# Patient Record
Sex: Female | Born: 1935 | Race: White | Hispanic: No | Smoking: Never smoker
Health system: Southern US, Community
[De-identification: ages and names within clinical notes are randomized; demographics above are authoritative.]

## PROBLEM LIST (undated history)

## (undated) DIAGNOSIS — N183 Chronic kidney disease, stage 3 unspecified: Secondary | ICD-10-CM

## (undated) DIAGNOSIS — I10 Essential (primary) hypertension: Secondary | ICD-10-CM

## (undated) DIAGNOSIS — H409 Unspecified glaucoma: Secondary | ICD-10-CM

## (undated) DIAGNOSIS — M1711 Unilateral primary osteoarthritis, right knee: Secondary | ICD-10-CM

## (undated) DIAGNOSIS — N6019 Diffuse cystic mastopathy of unspecified breast: Secondary | ICD-10-CM

## (undated) DIAGNOSIS — D72819 Decreased white blood cell count, unspecified: Secondary | ICD-10-CM

## (undated) DIAGNOSIS — K589 Irritable bowel syndrome without diarrhea: Secondary | ICD-10-CM

## (undated) DIAGNOSIS — R7989 Other specified abnormal findings of blood chemistry: Secondary | ICD-10-CM

## (undated) DIAGNOSIS — Z789 Other specified health status: Secondary | ICD-10-CM

## (undated) DIAGNOSIS — E559 Vitamin D deficiency, unspecified: Secondary | ICD-10-CM

## (undated) DIAGNOSIS — M810 Age-related osteoporosis without current pathological fracture: Secondary | ICD-10-CM

## (undated) HISTORY — PX: JOINT REPLACEMENT: SHX530

## (undated) HISTORY — PX: MOHS SURGERY: SHX181

## (undated) HISTORY — DX: Other specified abnormal findings of blood chemistry: R79.89

## (undated) HISTORY — DX: Essential (primary) hypertension: I10

## (undated) HISTORY — DX: Age-related osteoporosis without current pathological fracture: M81.0

## (undated) HISTORY — DX: Other specified health status: Z78.9

## (undated) HISTORY — DX: Vitamin D deficiency, unspecified: E55.9

## (undated) HISTORY — DX: Unilateral primary osteoarthritis, right knee: M17.11

## (undated) HISTORY — PX: OTHER SURGICAL HISTORY: SHX169

## (undated) HISTORY — DX: Chronic kidney disease, stage 3 (moderate): N18.3

## (undated) HISTORY — DX: Chronic kidney disease, stage 3 unspecified: N18.30

## (undated) HISTORY — DX: Irritable bowel syndrome without diarrhea: K58.9

## (undated) HISTORY — DX: Unspecified glaucoma: H40.9

## (undated) HISTORY — PX: BREAST BIOPSY: SHX20

## (undated) HISTORY — DX: Diffuse cystic mastopathy of unspecified breast: N60.19

## (undated) HISTORY — PX: CATARACT EXTRACTION, BILATERAL: SHX1313

---

## 2007-11-06 ENCOUNTER — Encounter: Admission: RE | Admit: 2007-11-06 | Discharge: 2007-12-13 | Payer: Self-pay | Admitting: Family Medicine

## 2013-12-02 ENCOUNTER — Inpatient Hospital Stay (HOSPITAL_COMMUNITY)
Admission: EM | Admit: 2013-12-02 | Discharge: 2013-12-04 | DRG: 682 | Disposition: A | Discharge status: Home Health | Payer: Medicare Other | Attending: Family Medicine | Admitting: Family Medicine

## 2013-12-02 ENCOUNTER — Emergency Department (HOSPITAL_COMMUNITY): Payer: Medicare Other

## 2013-12-02 ENCOUNTER — Encounter (HOSPITAL_COMMUNITY): Payer: Self-pay | Admitting: Emergency Medicine

## 2013-12-02 DIAGNOSIS — J189 Pneumonia, unspecified organism: Secondary | ICD-10-CM | POA: Diagnosis present

## 2013-12-02 DIAGNOSIS — R531 Weakness: Secondary | ICD-10-CM

## 2013-12-02 DIAGNOSIS — D72829 Elevated white blood cell count, unspecified: Secondary | ICD-10-CM | POA: Diagnosis present

## 2013-12-02 DIAGNOSIS — Z7982 Long term (current) use of aspirin: Secondary | ICD-10-CM

## 2013-12-02 DIAGNOSIS — E43 Unspecified severe protein-calorie malnutrition: Secondary | ICD-10-CM | POA: Insufficient documentation

## 2013-12-02 DIAGNOSIS — I10 Essential (primary) hypertension: Secondary | ICD-10-CM | POA: Diagnosis present

## 2013-12-02 DIAGNOSIS — M810 Age-related osteoporosis without current pathological fracture: Secondary | ICD-10-CM | POA: Diagnosis present

## 2013-12-02 DIAGNOSIS — D72819 Decreased white blood cell count, unspecified: Secondary | ICD-10-CM | POA: Diagnosis present

## 2013-12-02 DIAGNOSIS — R0902 Hypoxemia: Secondary | ICD-10-CM | POA: Diagnosis present

## 2013-12-02 DIAGNOSIS — Z66 Do not resuscitate: Secondary | ICD-10-CM

## 2013-12-02 DIAGNOSIS — R6883 Chills (without fever): Secondary | ICD-10-CM | POA: Diagnosis present

## 2013-12-02 DIAGNOSIS — IMO0002 Reserved for concepts with insufficient information to code with codable children: Secondary | ICD-10-CM

## 2013-12-02 DIAGNOSIS — E869 Volume depletion, unspecified: Secondary | ICD-10-CM | POA: Diagnosis present

## 2013-12-02 DIAGNOSIS — N179 Acute kidney failure, unspecified: Principal | ICD-10-CM | POA: Diagnosis present

## 2013-12-02 DIAGNOSIS — E860 Dehydration: Secondary | ICD-10-CM | POA: Diagnosis present

## 2013-12-02 DIAGNOSIS — R197 Diarrhea, unspecified: Secondary | ICD-10-CM | POA: Diagnosis present

## 2013-12-02 DIAGNOSIS — R5381 Other malaise: Secondary | ICD-10-CM | POA: Diagnosis present

## 2013-12-02 HISTORY — DX: Decreased white blood cell count, unspecified: D72.819

## 2013-12-02 LAB — BASIC METABOLIC PANEL
BUN: 60 mg/dL — ABNORMAL HIGH (ref 6–23)
Calcium: 9.4 mg/dL (ref 8.4–10.5)
Creatinine, Ser: 1.91 mg/dL — ABNORMAL HIGH (ref 0.50–1.10)
GFR calc Af Amer: 25 mL/min — ABNORMAL LOW (ref >90)
GFR calc non Af Amer: 22 mL/min — ABNORMAL LOW (ref >90)
Glucose, Bld: 114 mg/dL — ABNORMAL HIGH (ref 70–99)
Potassium: 4.4 mEq/L (ref 3.5–5.1)

## 2013-12-02 LAB — CBC WITH DIFFERENTIAL/PLATELET
Basophils Relative: 0 % (ref 0–1)
Eosinophils Absolute: 0 10*3/uL (ref 0.0–0.7)
Eosinophils Relative: 0 % (ref 0–5)
HCT: 29.4 % — ABNORMAL LOW (ref 36.0–46.0)
Hemoglobin: 9.8 g/dL — ABNORMAL LOW (ref 12.0–15.0)
Lymphs Abs: 1.2 10*3/uL (ref 0.7–4.0)
MCH: 30.4 pg (ref 26.0–34.0)
MCHC: 33.3 g/dL (ref 30.0–36.0)
MCV: 91.3 fL (ref 78.0–100.0)
Monocytes Absolute: 0.8 10*3/uL (ref 0.1–1.0)
Monocytes Relative: 7 % (ref 3–12)
Neutrophils Relative %: 83 % — ABNORMAL HIGH (ref 43–77)
Platelets: 294 10*3/uL (ref 150–400)
RBC: 3.22 MIL/uL — ABNORMAL LOW (ref 3.87–5.11)
RDW: 13.5 % (ref 11.5–15.5)

## 2013-12-02 LAB — URINALYSIS, ROUTINE W REFLEX MICROSCOPIC
Bilirubin Urine: NEGATIVE
Glucose, UA: NEGATIVE mg/dL
Hgb urine dipstick: NEGATIVE
Protein, ur: NEGATIVE mg/dL
Urobilinogen, UA: 0.2 mg/dL (ref 0.0–1.0)

## 2013-12-02 LAB — URINE MICROSCOPIC-ADD ON

## 2013-12-02 MED ORDER — DEXTROSE 5 % IV SOLN
1.0000 g | Freq: Once | INTRAVENOUS | Status: AC
  Administered 2013-12-02: 1 g via INTRAVENOUS
  Filled 2013-12-02: qty 10

## 2013-12-02 MED ORDER — LEVOFLOXACIN IN D5W 750 MG/150ML IV SOLN
750.0000 mg | INTRAVENOUS | Status: DC
  Administered 2013-12-02 – 2013-12-04 (×2): 750 mg via INTRAVENOUS
  Filled 2013-12-02 (×2): qty 150

## 2013-12-02 MED ORDER — ASPIRIN EC 81 MG PO TBEC
81.0000 mg | DELAYED_RELEASE_TABLET | Freq: Every day | ORAL | Status: DC
Start: 2013-12-02 — End: 2013-12-04
  Administered 2013-12-02 – 2013-12-04 (×3): 81 mg via ORAL
  Filled 2013-12-02 (×3): qty 1

## 2013-12-02 MED ORDER — ONDANSETRON HCL 4 MG/2ML IJ SOLN
4.0000 mg | Freq: Once | INTRAMUSCULAR | Status: DC
Start: 2013-12-02 — End: 2013-12-04
  Filled 2013-12-02: qty 2

## 2013-12-02 MED ORDER — ONDANSETRON HCL 4 MG PO TABS: 4.0000 mg | ORAL_TABLET | Freq: Four times a day (QID) | ORAL | Status: DC | PRN

## 2013-12-02 MED ORDER — DEXTROSE 5 % IV SOLN
500.0000 mg | Freq: Once | INTRAVENOUS | Status: DC
  Administered 2013-12-02: 500 mg via INTRAVENOUS

## 2013-12-02 MED ORDER — DEXTROSE-NACL 5-0.9 % IV SOLN
INTRAVENOUS | Status: DC
  Administered 2013-12-02 – 2013-12-03 (×3): via INTRAVENOUS
  Filled 2013-12-02: qty 1000

## 2013-12-02 MED ORDER — BOOST PLUS PO LIQD
237.0000 mL | Freq: Two times a day (BID) | ORAL | Status: DC
  Administered 2013-12-02 – 2013-12-04 (×3): 237 mL via ORAL
  Filled 2013-12-02 (×5): qty 237

## 2013-12-02 MED ORDER — ONDANSETRON HCL 4 MG/2ML IJ SOLN
4.0000 mg | Freq: Four times a day (QID) | INTRAMUSCULAR | Status: DC | PRN
  Administered 2013-12-02: 4 mg via INTRAVENOUS
  Filled 2013-12-02: qty 2

## 2013-12-02 MED ORDER — SODIUM CHLORIDE 0.9 % IV BOLUS (SEPSIS)
500.0000 mL | Freq: Once | INTRAVENOUS | Status: AC
  Administered 2013-12-02: 500 mL via INTRAVENOUS

## 2013-12-02 MED ORDER — SODIUM CHLORIDE 0.9 % IV SOLN
INTRAVENOUS | Status: DC
  Administered 2013-12-02: 125 mL/h via INTRAVENOUS

## 2013-12-02 NOTE — ED Notes (Signed)
Bed: WU98 Expected date: 12/02/13 Expected time: 1:14 AM Means of arrival: Ambulance Comments: Weakness, dehydration,

## 2013-12-02 NOTE — ED Provider Notes (Signed)
TIME SEEN: 1:38 AM  CHIEF COMPLAINT: Dehydration, generalized weakness and fatigue, chills, anorexia  HPI: Patient is a 79 year old female with history of hypertension and prior leukopenia who presents to the emergency department with complaints of 2 weeks of chills, anorexia, generalized weakness and fatigue. Patient was seen by her primary care physician, Dr. Sigmund Hazel. She had labs drawn this evening and was called to come into the emergency department for evaluation. Patient had a leukocytosis of 15.1, a creatinine of 2.17 (which has increased from 1.29 on 08/16/12), and ESR of 73. Patient's hemoglobin was 10.6 and her TSH 1.66. Patient was instructed to come to the emergency department for IV hydration and further evaluation. Patient denies having any pain. She denies chest pain or abdominal pain. No shortness of breath. No cough, vomiting, dysuria or hematuria. She has had intermittent diarrhea. Patient was mildly hypoxic initially in the emergency department and placed on oxygen. She denies a history of tobacco use, COPD or asthma, oxygen requirement at home. No history of PE or DVT.  ROS: See HPI Constitutional: no fever  Eyes: no drainage  ENT: no runny nose   Cardiovascular:  no chest pain  Resp: no SOB  GI: no vomiting GU: no dysuria Integumentary: no rash  Allergy: no hives  Musculoskeletal: no leg swelling  Neurological: no slurred speech ROS otherwise negative  PAST MEDICAL HISTORY/PAST SURGICAL HISTORY:  No past medical history on file.  MEDICATIONS:  Prior to Admission medications   Not on File    ALLERGIES:  Allergies not on file  SOCIAL HISTORY:  History  Substance Use Topics  . Smoking status: Not on file  . Smokeless tobacco: Not on file  . Alcohol Use: Not on file   denies alcohol or tobacco use  FAMILY HISTORY: No family history on file.  EXAM: BP 115/49  Pulse 92  Temp(Src) 97.8 F (36.6 C) (Oral)  Resp 20  Wt 142 lb (64.411 kg)  SpO2  92% CONSTITUTIONAL: Alert and oriented and responds appropriately to questions. Elderly, no apparent distress HEAD: Normocephalic EYES: Conjunctivae clear, PERRL ENT: normal nose; no rhinorrhea; dry mucous membranes; pharynx without lesions noted NECK: Supple, no meningismus, no LAD  CARD: RRR; S1 and S2 appreciated; no murmurs, no clicks, no rubs, no gallops RESP: Normal chest excursion without splinting or tachypnea; breath sounds clear and equal bilaterally; no wheezes, no rhonchi, no rales,  ABD/GI: Normal bowel sounds; non-distended; soft, non-tender, no rebound, no guarding BACK:  The back appears normal and is non-tender to palpation, there is no CVA tenderness EXT: Normal ROM in all joints; non-tender to palpation; no edema; normal capillary refill; no cyanosis    SKIN: Normal color for age and race; warm NEURO: Moves all extremities equally, no facial droop or slurred speech PSYCH: The patient's mood and manner are appropriate. Grooming and personal hygiene are appropriate.  MEDICAL DECISION MAKING: Patient here with failure to thrive, anorexia and dehydration. She is hemodynamically stable. Will repeat blood work and obtain chest x-ray and urinalysis to rule out infectious etiology. Her abdominal exam is completely benign. Will continue IV hydration. Patient will likely need admission.  ED PROGRESS: Patient's labs show leukocytosis of 11.7 with left shift. Her creatinine is elevated at 1.91 and her bicarb is low at 16 - possibly from diarrhea.  Glucose only slightly elevated at 114.  Will also obtain lactate.  Patient's chest x-ray shows possible mild bibasilar opacities. Blood cultures pending. Will give ceftriaxone and azithromycin for CAP.   Spoke  with Dr. Conley Rolls for admission.     Date: 12/02/2013 1:39  Rate: 85  Rhythm: normal sinus rhythm  QRS Axis: normal  Intervals: normal  ST/T Wave abnormalities: normal  Conduction Disutrbances: none  Narrative Interpretation:  unremarkable; no ischemic changes, no ectopy, no arrhythmia, artifact present      Layla Maw Ward, DO 12/02/13 [phone]

## 2013-12-02 NOTE — Progress Notes (Signed)
Patient seen and examined. Neighbor Sarah at bedside. She is still feeling weak. Will request PT/OT evals. Will recheck BMET in the am to follow renal function.  Peggye Pitt, MD [facility] Pager: [phone]

## 2013-12-02 NOTE — H&P (Signed)
[facility] History and Physical  Mackenzie Baker ZOX:[id number] DOB: November 13, 1920    PCP:   Sigmund Hazel, MD.  Chief Complaint: weakness and found to have abnormal labs at PCP (Cr elevation)  HPI: Mackenzie Baker is an 79 y.o. female with hx of HTN on Zesteretic, osteoporosis on Fosamax, and taking celebrex, presented to her PCP complaining of general malaise.  She has no coughs, fever, chills, abdominal pain, but had intermittent lose stool that's not new for her.  Apparently, she was given Lasix about 2 weeks ago for bilateral lower extremity swellings, and took them for a week, then stopped as it made her ill.  She hasn't been able to eat or drink and felt more weak.  She said she has been depressed, but there is no significant identifiable stressor in her life. She lives alone.  Evalaution in the PCP office showed elevated Cr, so she was sent here. Repeat labs in the ER showed Cr of 1.91, with WBC of 11K, and Hb of 9.8 grams per dL. Her CXR showed possible atelectasis vs developing PNA. Hospitalist was asked to admit her for volume depletion, weakness, and possible CAP.  She was given IV Rocephin in the ER.  Rewiew of Systems:  Constitutional: Negative for malaise, fever and chills. No significant weight loss or weight gain Eyes: Negative for eye pain, redness and discharge, diplopia, visual changes, or flashes of light. ENMT: Negative for ear pain, hoarseness, nasal congestion, sinus pressure and sore throat. No headaches; tinnitus, drooling, or problem swallowing. Cardiovascular: Negative for chest pain, palpitations, diaphoresis, dyspnea and peripheral edema. ; No orthopnea, PND Respiratory: Negative for cough, hemoptysis, wheezing and stridor. No pleuritic chestpain. Gastrointestinal: Negative for nausea, vomiting, diarrhea, constipation, abdominal pain, melena, blood in stool, hematemesis, jaundice and rectal bleeding.    Genitourinary: Negative for frequency, dysuria,  incontinence,flank pain and hematuria; Musculoskeletal: Negative for back pain and neck pain. Negative for trauma.;  Skin: . Negative for pruritus, rash, abrasions, bruising and skin lesion.; ulcerations Neuro: Negative for headache, lightheadedness and neck stiffness. Negative for weakness, altered level of consciousness , altered mental status,  burning feet, involuntary movement, seizure and syncope.  Psych: negative for anxiety, depression, insomnia, tearfulness, panic attacks, hallucinations, paranoia, suicidal or homicidal ideation    Past Medical History  Diagnosis Date  . Leukopenia     Past Surgical History  Procedure Laterality Date  . Joint replacement    . Cesarean section    . Breast biopsy      Medications:  HOME MEDS: Prior to Admission medications   Medication Sig Start Date End Date Taking? Authorizing Provider  alendronate (FOSAMAX) 70 MG tablet Take 70 mg by mouth once a week. Take with a full glass of water on an empty stomach.   Takes on thursdays   Yes Historical Provider, MD  aspirin EC 81 MG tablet Take 81 mg by mouth daily.   Yes Historical Provider, MD  celecoxib (CELEBREX) 200 MG capsule Take 200 mg by mouth daily.   Yes Historical Provider, MD  Cholecalciferol (VITAMIN D PO) Take 5,000 Units by mouth daily.   Yes Historical Provider, MD  hydrochlorothiazide (MICROZIDE) 12.5 MG capsule Take 12.5 mg by mouth daily.   Yes Historical Provider, MD  lisinopril (PRINIVIL,ZESTRIL) 40 MG tablet Take 40 mg by mouth daily.   Yes Historical Provider, MD  omeprazole (PRILOSEC) 20 MG capsule Take 20 mg by mouth 2 (two) times daily before a meal.   Yes Historical Provider, MD  Allergies:  Allergies  Allergen Reactions  . Penicillins Nausea And Vomiting    Social History:   reports that she has never smoked. She does not have any smokeless tobacco history on file. She reports that she does not drink alcohol. Her drug history is not on file.  Family  History: No family history on file.   Physical Exam: Filed Vitals:   12/02/13 0132  BP: 115/49  Pulse: 92  Temp: 97.8 F (36.6 C)  TempSrc: Oral  Resp: 20  Weight: 64.411 kg (142 lb)  SpO2: 92%   Blood pressure 115/49, pulse 92, temperature 97.8 F (36.6 C), temperature source Oral, resp. rate 20, weight 64.411 kg (142 lb), SpO2 92.00%.  GEN:  Pleasant patient lying in the stretcher in no acute distress; cooperative with exam. PSYCH:  alert and oriented x4; does not appear anxious or depressed; affect is appropriate. HEENT: Mucous membranes pink and anicteric; PERRLA; EOM intact; no cervical lymphadenopathy nor thyromegaly or carotid bruit; no JVD; There were no stridor. Neck is very supple. Breasts:: Not examined CHEST WALL: No tenderness CHEST: Normal respiration, clear to auscultation bilaterally.  HEART: Regular rate and rhythm.  There are no murmur, rub, or gallops.   BACK: No kyphosis or scoliosis; no CVA tenderness ABDOMEN: soft and non-tender; no masses, no organomegaly, normal abdominal bowel sounds; no pannus; no intertriginous candida. There is no rebound and no distention. Rectal Exam: Not done EXTREMITIES: No bone or joint deformity; age-appropriate arthropathy of the hands and knees;  no ulcerations.  There is no calf tenderness. Genitalia: not examined PULSES: 2+ and symmetric SKIN: Normal hydration no rash or ulceration CNS: Cranial nerves 2-12 grossly intact no focal lateralizing neurologic deficit.  Speech is fluent; uvula elevated with phonation, facial symmetry and tongue midline. DTR are normal bilaterally, cerebella exam is intact, barbinski is negative and strengths are equaled bilaterally.  No sensory loss.   Labs on Admission:  Basic Metabolic Panel:  Recent Labs Lab 12/02/13 0223  NA 137  K 4.4  CL 104  CO2 16*  GLUCOSE 114*  BUN 60*  CREATININE 1.91*  CALCIUM 9.4   Liver Function Tests: No results found for this basename: AST, ALT,  ALKPHOS, BILITOT, PROT, ALBUMIN,  in the last 168 hours No results found for this basename: LIPASE, AMYLASE,  in the last 168 hours No results found for this basename: AMMONIA,  in the last 168 hours CBC:  Recent Labs Lab 12/02/13 0223  WBC 11.7*  NEUTROABS 9.7*  HGB 9.8*  HCT 29.4*  MCV 91.3  PLT 294   Cardiac Enzymes: No results found for this basename: CKTOTAL, CKMB, CKMBINDEX, TROPONINI,  in the last 168 hours  CBG: No results found for this basename: GLUCAP,  in the last 168 hours   Radiological Exams on Admission: Dg Chest 2 View  12/02/2013   CLINICAL DATA:  Shortness of breath and weakness.  EXAM: CHEST  2 VIEW  COMPARISON:  Chest radiograph performed 12/01/2013  FINDINGS: The lungs are well-aerated. Mild bibasilar opacities may reflect atelectasis or possibly mild pneumonia. Mild peribronchial thickening is seen. No pleural effusion or pneumothorax is seen.  The heart is normal in size; the mediastinal contour is within normal limits. No acute osseous abnormalities are seen. Degenerative change is noted at the upper lumbar spine.  IMPRESSION: Mild bibasilar airspace opacities may reflect atelectasis or possibly mild pneumonia. Mild peribronchial thickening seen.   Electronically Signed   By: Roanna Raider M.D.   On: 12/02/2013 03:23  Assessment/Plan Present on Admission:  . Volume depletion . AKI (acute kidney injury) . CAP (community acquired pneumonia) . HTN (hypertension) . Dehydration  PLAN: Although I cannot find an old Cr, I assume she has pre-renal AKI.  She was diuresed for bilateral pedal edema with Lasix, which was new medication for her, which she took for one week.  Will rehydrate her and follow Cr carefully.  Please be careful not to overhydrate her.  I don't think she has PNA, with no coughs, and no fever, but she has slight hypoxia and a mild leukocytosis, so I will give IV Levoquin.  Her Cr is elevated so let's hold the Zesteretic for now.  Will also d/c  the celebrex as well until at least her renal fx recovers.  She is a DNR (confirmed with her and her daughter tonight), and will be admitted to [facility] service.  Thank you for allowing me to participate in her care.  Other plans as per orders.  Code Status: DNR.   Houston Siren, MD. [facility] Pager [phone] 7pm to 7am.  12/02/2013, 4:43 AM

## 2013-12-02 NOTE — Progress Notes (Signed)
INITIAL NUTRITION ASSESSMENT  Pt meets criteria for severe MALNUTRITION in the context of acute illness as evidenced by <50% estimated energy intake with 5.3% weight loss in the past 2 weeks per pt report.  DOCUMENTATION CODES Per approved criteria  -Severe malnutrition in the context of acute illness or injury   INTERVENTION: - Boost Plus BID - Encouraged increased meal intake - Recommend Florastor to help with diarrhea - Will continue to monitor   NUTRITION DIAGNOSIS: Unintended weight loss related to poor appetite as evidenced by pt report.   Goal: 1. Pt to consume >90% of meals/supplements 2. No further diarrhea  Monitor:  Weights, labs, intake, diarrhea  Reason for Assessment: Malnutrition screening tool   79 y.o. female  Admitting Dx: Volume depletion  ASSESSMENT: Pt with hx of HTN and osteoporosis, admitted with c/o general malaise. Pt with hx of intermittent loose stools and has been depressed.   Met with pt who reports poor appetite for the past 2 weeks with at least 8 pound unintended weight loss. States she has been living of off 1 Boost/day and drinking water. Pt has had 3 episodes of diarrhea today. Pt able to eat some of her breakfast potatoes this morning.   BUN/Cr elevated with low GFR  Height: Ht Readings from Last 1 Encounters:  12/02/13 5\' 6"  (1.676 m)    Weight: Wt Readings from Last 1 Encounters:  12/02/13 142 lb (64.411 kg)    Ideal Body Weight: 130 lb   % Ideal Body Weight: 109%  Wt Readings from Last 10 Encounters:  12/02/13 142 lb (64.411 kg)    Usual Body Weight: 150 lb per pt  % Usual Body Weight: 95%  BMI:  Body mass index is 22.93 kg/(m^2).  Estimated Nutritional Needs: Kcal: 1600-1800 Protein: 75-90g Fluid: 1.6-1.8L/day  Skin: +1 RLE, LLE edema  Diet Order: General  EDUCATION NEEDS: -No education needs identified at this time   Intake/Output Summary (Last 24 hours) at 12/02/13 1333 Last data filed at 12/02/13  1100  Gross per 24 hour  Intake   1035 ml  Output    950 ml  Net     85 ml    Last BM: 12/7  Labs:   Recent Labs Lab 12/02/13 0223  NA 137  K 4.4  CL 104  CO2 16*  BUN 60*  CREATININE 1.91*  CALCIUM 9.4  GLUCOSE 114*    CBG (last 3)  No results found for this basename: GLUCAP,  in the last 72 hours  Scheduled Meds: . aspirin EC  81 mg Oral Daily  . levofloxacin (LEVAQUIN) IV  750 mg Intravenous Q48H  . ondansetron  4 mg Intravenous Once    Continuous Infusions: . dextrose 5 % and 0.9% NaCl 75 mL/hr at 12/02/13 1610    Past Medical History  Diagnosis Date  . Leukopenia     Past Surgical History  Procedure Laterality Date  . Joint replacement    . Cesarean section    . Breast biopsy      Levon Hedger MS, RD, LDN [phone] Pager [phone] After Hours Pager

## 2013-12-02 NOTE — ED Notes (Signed)
Per EMS. Pt. Saw doctor yesterday morning and was told she was dehydrated and labs were not normal. Doctor called today and told pt. She should go to the ED. Pt denies N/V and fever. A&Ox4. NAD noted.

## 2013-12-02 NOTE — Care Management Note (Signed)
    Page 1 of 1   12/02/2013     2:40:11 PM   CARE MANAGEMENT NOTE 12/02/2013  Patient:  Mackenzie Baker, Mackenzie Baker   Account Number:  [id number]  Date Initiated:  12/02/2013  Documentation initiated by:  Lanier Clam  Subjective/Objective Assessment:   79 Y/O F ADMITTED W/DEHDRATION, AKI,PNA.     Action/Plan:   FROM HOME ALONE.HAS PCP, PHARMACY.   Anticipated DC Date:  12/05/2013   Anticipated DC Plan:  HOME/SELF CARE      DC Planning Services  CM consult      Choice offered to / List presented to:             Status of service:  In process, will continue to follow Medicare Important Message given?   (If response is "NO", the following Medicare IM given date fields will be blank) Date Medicare IM given:   Date Additional Medicare IM given:    Discharge Disposition:    Per UR Regulation:  Reviewed for med. necessity/level of care/duration of stay  If discussed at Long Length of Stay Meetings, dates discussed:    Comments:  12/02/13 Zyden Suman RN,BSN NCM [phone] WOULD RECOMMEND PT/OT CONS WHEN MD FEEL MED APPROPRIATE.

## 2013-12-03 DIAGNOSIS — E43 Unspecified severe protein-calorie malnutrition: Secondary | ICD-10-CM | POA: Insufficient documentation

## 2013-12-03 LAB — BASIC METABOLIC PANEL
BUN: 29 mg/dL — ABNORMAL HIGH (ref 6–23)
Chloride: 106 mEq/L (ref 96–112)
Creatinine, Ser: 1.39 mg/dL — ABNORMAL HIGH (ref 0.50–1.10)
GFR calc Af Amer: 37 mL/min — ABNORMAL LOW (ref >90)
GFR calc non Af Amer: 32 mL/min — ABNORMAL LOW (ref >90)
Potassium: 3.3 mEq/L — ABNORMAL LOW (ref 3.5–5.1)
Sodium: 135 mEq/L (ref 135–145)

## 2013-12-03 LAB — CBC
HCT: 27.5 % — ABNORMAL LOW (ref 36.0–46.0)
MCHC: 33.8 g/dL (ref 30.0–36.0)
Platelets: 239 10*3/uL (ref 150–400)
RBC: 3 MIL/uL — ABNORMAL LOW (ref 3.87–5.11)
RDW: 13.8 % (ref 11.5–15.5)
WBC: 11.8 10*3/uL — ABNORMAL HIGH (ref 4.0–10.5)

## 2013-12-03 LAB — URINE CULTURE

## 2013-12-03 NOTE — Evaluation (Signed)
Occupational Therapy Evaluation Patient Details Name: Mackenzie Baker MRN: [id number] DOB: February 08, 1920 Today's Date: 12/03/2013 Time: 3474-2595 OT Time Calculation (min): 18 min  OT Assessment / Plan / Recommendation History of present illness Mackenzie Baker is an 79 y.o. female with hx of HTN on Zesteretic, osteoporosis on Fosamax, and taking celebrex, presented to her PCP complaining of general malaise.  She has no coughs, fever, chills, abdominal pain, but had intermittent lose stool that's not new for her.  Apparently, she was given Lasix about 2 weeks ago for bilateral lower extremity swellings, and took them for a week, then stopped as it made her ill.  She hasn't been able to eat or drink and felt more weak.  She said she has been depressed, but there is no significant identifiable stressor in her life. She lives alone.  Evalaution in the PCP office showed elevated Cr, so she was sent here. Repeat labs in the ER showed Cr of 1.91, with WBC of 11K, and Hb of 9.8 grams per dL. Her CXR showed possible atelectasis vs developing PNA. Hospitalist was asked to admit her for volume depletion, weakness, and possible CAP.  She was given IV Rocephin in the ER.   Clinical Impression   Pt presents to OT with decreased I with ADL activity due to problems listed below. Will benefit from skilled OT to increase I with ADL activity and return to PLOF    OT Assessment  Patient needs continued OT Services    Follow Up Recommendations  Home health OT       Equipment Recommendations  3 in 1 bedside comode       Frequency  Min 2X/week    Precautions / Restrictions Precautions Precautions: Fall Restrictions Weight Bearing Restrictions: No       ADL  Grooming: Set up Where Assessed - Grooming: Unsupported sitting Upper Body Bathing: Set up Where Assessed - Upper Body Bathing: Unsupported sitting Lower Body Bathing: Minimal assistance Where Assessed - Lower Body Bathing: Unsupported sit to  stand Upper Body Dressing: Set up Where Assessed - Upper Body Dressing: Unsupported sitting Lower Body Dressing: Minimal assistance Where Assessed - Lower Body Dressing: Unsupported sit to stand Toilet Transfer: Minimal assistance Toilet Transfer Method: Sit to stand Toilet Transfer Equipment: Regular height toilet;Bedside commode Toileting - Clothing Manipulation and Hygiene: Minimal assistance Where Assessed - Toileting Clothing Manipulation and Hygiene: Standing    OT Diagnosis: Generalized weakness  OT Problem List: Decreased strength;Decreased activity tolerance OT Treatment Interventions: Self-care/ADL training;Patient/family education;DME and/or AE instruction   OT Goals(Current goals can be found in the care plan section) Acute Rehab OT Goals Patient Stated Goal: get better and take care of my self OT Goal Formulation: With patient Time For Goal Achievement: 12/17/13  Visit Information  Last OT Received On: 12/03/13 Assistance Needed: +1 History of Present Illness: Mackenzie Baker is an 79 y.o. female with hx of HTN on Zesteretic, osteoporosis on Fosamax, and taking celebrex, presented to her PCP complaining of general malaise.  She has no coughs, fever, chills, abdominal pain, but had intermittent lose stool that's not new for her.  Apparently, she was given Lasix about 2 weeks ago for bilateral lower extremity swellings, and took them for a week, then stopped as it made her ill.  She hasn't been able to eat or drink and felt more weak.  She said she has been depressed, but there is no significant identifiable stressor in her life. She lives alone.  Evalaution in the PCP office showed  elevated Cr, so she was sent here. Repeat labs in the ER showed Cr of 1.91, with WBC of 11K, and Hb of 9.8 grams per dL. Her CXR showed possible atelectasis vs developing PNA. Hospitalist was asked to admit her for volume depletion, weakness, and possible CAP.  She was given IV Rocephin in the ER.        Prior Functioning     Home Living Family/patient expects to be discharged to:: Private residence Living Arrangements: Alone Available Help at Discharge: Family Type of Home: Apartment Home Layout: One level Home Equipment: Emergency planning/management officer - 4 wheels Prior Function Level of Independence: Independent with assistive device(s) Communication Communication: No difficulties         Vision/Perception Vision - History Baseline Vision: Wears glasses all the time   Cognition  Cognition Arousal/Alertness: Awake/alert Behavior During Therapy: WFL for tasks assessed/performed Overall Cognitive Status: Within Functional Limits for tasks assessed    Extremity/Trunk Assessment Upper Extremity Assessment Upper Extremity Assessment: Overall WFL for tasks assessed     Mobility Bed Mobility Bed Mobility: Sit to Supine Sit to Supine: 4: Min guard;With rail;HOB elevated Transfers Transfers: Sit to Stand;Stand to Sit Sit to Stand: From toilet;4: Min assist;With upper extremity assist Stand to Sit: To bed;4: Min assist;With upper extremity assist           End of Session OT - End of Session Activity Tolerance: Patient tolerated treatment well Patient left: in bed  GO     Mackenzie Baker, [place]ung 12/03/2013, 11:47 AM

## 2013-12-03 NOTE — Evaluation (Signed)
Physical Therapy Evaluation Patient Details Name: Mackenzie Baker MRN: [id number] DOB: April 11, 1920 Today's Date: 12/03/2013 Time: 4098-1191 PT Time Calculation (min): 28 min  PT Assessment / Plan / Recommendation History of Present Illness  Mackenzie Baker is an 79 y.o. female with hx of HTN on Zesteretic, osteoporosis on Fosamax, and taking celebrex, presented to her PCP complaining of general malaise.  She has no coughs, fever, chills, abdominal pain, but had intermittent lose stool that's not new for her.  Apparently, she was given Lasix about 2 weeks ago for bilateral lower extremity swellings, and took them for a week, then stopped as it made her ill.  She hasn't been able to eat or drink and felt more weak.  She said she has been depressed, but there is no significant identifiable stressor in her life. She lives alone.  Evalaution in the PCP office showed elevated Cr, so she was sent here. Repeat labs in the ER showed Cr of 1.91, with WBC of 11K, and Hb of 9.8 grams per dL. Her CXR showed possible atelectasis vs developing PNA. Hospitalist was asked to admit her for volume depletion, weakness, and possible CAP.  She was given IV Rocephin in the ER.  Clinical Impression  On eval, pt required Min guard assist for mobility-able to ambulate ~55 feet with walker. Demonstrates general weakness and decreased activity tolerance. Recommend HHPT, HHOT, home health aide (if possible) to improve functional mobility and independence.     PT Assessment  Patient needs continued PT services    Follow Up Recommendations  Home health PT;Supervision - Intermittent (home health aide, if possible)    Does the patient have the potential to tolerate intense rehabilitation      Barriers to Discharge        Equipment Recommendations  None recommended by PT    Recommendations for Other Services OT consult   Frequency Min 3X/week    Precautions / Restrictions Precautions Precautions:  Fall Restrictions Weight Bearing Restrictions: No   Pertinent Vitals/Pain No c/o pain      Mobility  Bed Mobility Bed Mobility: Supine to Sit;Sit to Supine Supine to Sit: 4: Min guard;HOB elevated;With rails Sit to Supine: 4: Min guard;HOB elevated;With rail Transfers Transfers: Sit to Stand;Stand to Sit Sit to Stand: 4: Min guard;From bed;From chair/3-in-1 Stand to Sit: 4: Min guard;To chair/3-in-1;To bed Details for Transfer Assistance: VCS safety, hand placement Ambulation/Gait Ambulation/Gait Assistance: 4: Min guard Ambulation Distance (Feet): 55 Feet Assistive device: Rolling walker Ambulation/Gait Assistance Details: slow gait speed. fatigues fairly easily. close guard for safety Gait Pattern: Step-through pattern;Trunk flexed;Decreased stride length    Exercises     PT Diagnosis: Difficulty walking;Generalized weakness  PT Problem List: Decreased strength;Decreased mobility;Decreased activity tolerance;Decreased balance;Decreased knowledge of use of DME PT Treatment Interventions: DME instruction;Gait training;Functional mobility training;Therapeutic activities;Therapeutic exercise;Patient/family education     PT Goals(Current goals can be found in the care plan section) Acute Rehab PT Goals Patient Stated Goal: get better and take care of my self PT Goal Formulation: With patient/family Time For Goal Achievement: 12/17/13 Potential to Achieve Goals: Good  Visit Information  Last PT Received On: 12/03/13 Assistance Needed: +1 History of Present Illness: Mackenzie Baker is an 79 y.o. female with hx of HTN on Zesteretic, osteoporosis on Fosamax, and taking celebrex, presented to her PCP complaining of general malaise.  She has no coughs, fever, chills, abdominal pain, but had intermittent lose stool that's not new for her.  Apparently, she was given Lasix about 2 weeks ago  for bilateral lower extremity swellings, and took them for a week, then stopped as it made her  ill.  She hasn't been able to eat or drink and felt more weak.  She said she has been depressed, but there is no significant identifiable stressor in her life. She lives alone.  Evalaution in the PCP office showed elevated Cr, so she was sent here. Repeat labs in the ER showed Cr of 1.91, with WBC of 11K, and Hb of 9.8 grams per dL. Her CXR showed possible atelectasis vs developing PNA. Hospitalist was asked to admit her for volume depletion, weakness, and possible CAP.  She was given IV Rocephin in the ER.       Prior Functioning  Home Living Family/patient expects to be discharged to:: Private residence Living Arrangements: Alone Available Help at Discharge: Family;Available PRN/intermittently Type of Home: Apartment Home Access: Ramped entrance Home Layout: One level Home Equipment: Walker - 2 wheels;Walker - 4 wheels;Shower seat;Wheelchair - manual;Cane - single point;Toilet riser Prior Function Level of Independence: Independent with assistive device(s) Communication Communication: No difficulties    Cognition  Cognition Arousal/Alertness: Awake/alert Behavior During Therapy: WFL for tasks assessed/performed Overall Cognitive Status: Within Functional Limits for tasks assessed    Extremity/Trunk Assessment Upper Extremity Assessment Upper Extremity Assessment: Generalized weakness Lower Extremity Assessment Lower Extremity Assessment: Generalized weakness Cervical / Trunk Assessment Cervical / Trunk Assessment: Normal   Balance Balance Balance Assessed: Yes Static Standing Balance Static Standing - Balance Support: Bilateral upper extremity supported Static Standing - Level of Assistance: 5: Stand by assistance Dynamic Standing Balance Dynamic Standing - Balance Support: Bilateral upper extremity supported Dynamic Standing - Level of Assistance: 5: Stand by assistance  End of Session PT - End of Session Equipment Utilized During Treatment: Gait belt Activity Tolerance:  Patient tolerated treatment well;Patient limited by fatigue Patient left: in bed;with call bell/phone within reach;with family/visitor present Nurse Communication: Mobility status  GP     Rebeca Alert, MPT Pager: [phone]

## 2013-12-03 NOTE — Progress Notes (Signed)
Mackenzie Baker ZOX:[id number] DOB: [birth year] DOA: 12/02/2013 PCP: Neldon Labella, MD  Brief narrative: Pleasant 79 year old ? known hypertension osteoporosis admitted with NA 1.91 in setting of diuretic use  Past medical history-As per Problem list Chart reviewed as below- None  Consultants:  None  Procedures:  None  Antibiotics:  None   Subjective  Well. Feels at baseline. Just finished working with therapy.   Objective    Interim History: nad   Telemetry: none  Objective: Filed Vitals:   12/02/13 1413 12/02/13 2200 12/03/13 0600 12/03/13 1425  BP: 94/54 120/63 106/54 111/47  Pulse: 91 86 97 86  Temp: 98.1 F (36.7 C) 99.9 F (37.7 C) 98.9 F (37.2 C) 98.7 F (37.1 C)  TempSrc: Oral Oral Oral Oral  Resp: 20 20 20 20   Height:      Weight:      SpO2: 94% 98% 96% 97%    Intake/Output Summary (Last 24 hours) at 12/03/13 1539 Last data filed at 12/03/13 0900  Gross per 24 hour  Intake    915 ml  Output    300 ml  Net    615 ml    Exam:  General: eomi Cardiovascular: s1 s2no m/r/g Respiratory: clear Abdomen: soft, NT/ND Skin Mild LE edema Neuro NO wekaness anywhere  Data Reviewed: Basic Metabolic Panel:  Recent Labs Lab 12/02/13 0223 12/03/13 0540  NA 137 135  K 4.4 3.3*  CL 104 106  CO2 16* 18*  GLUCOSE 114* 142*  BUN 60* 29*  CREATININE 1.91* 1.39*  CALCIUM 9.4 8.1*   Liver Function Tests: No results found for this basename: AST, ALT, ALKPHOS, BILITOT, PROT, ALBUMIN,  in the last 168 hours No results found for this basename: LIPASE, AMYLASE,  in the last 168 hours No results found for this basename: AMMONIA,  in the last 168 hours CBC:  Recent Labs Lab 12/02/13 0223 12/03/13 0540  WBC 11.7* 11.8*  NEUTROABS 9.7*  --   HGB 9.8* 9.3*  HCT 29.4* 27.5*  MCV 91.3 91.7  PLT 294 239   Cardiac Enzymes: No results found for this basename: CKTOTAL, CKMB, CKMBINDEX, TROPONINI,  in the last 168 hours BNP: No components  found with this basename: POCBNP,  CBG: No results found for this basename: GLUCAP,  in the last 168 hours  Recent Results (from the past 240 hour(s))  CULTURE, BLOOD (ROUTINE X 2)     Status: None   Collection Time    12/02/13  2:23 AM      Result Value Range Status   Specimen Description BLOOD LEFT ANTECUBITAL   Final   Special Requests BOTTLES DRAWN AEROBIC AND ANAEROBIC 5CC   Final   Culture  Setup Time     Final   Value: 12/02/2013 08:57     Performed at [facility]   Culture     Final   Value:        BLOOD CULTURE RECEIVED NO GROWTH TO DATE CULTURE WILL BE HELD FOR 5 DAYS BEFORE ISSUING A FINAL NEGATIVE REPORT     Performed at [facility]   Report Status PENDING   Incomplete  CULTURE, BLOOD (ROUTINE X 2)     Status: None   Collection Time    12/02/13  2:35 AM      Result Value Range Status   Specimen Description BLOOD LEFT ANTECUBITAL   Final   Special Requests BOTTLES DRAWN AEROBIC AND ANAEROBIC RIGHT ARM   Final   Culture  Setup  Time     Final   Value: 12/02/2013 08:56     Performed at [facility]   Culture     Final   Value:        BLOOD CULTURE RECEIVED NO GROWTH TO DATE CULTURE WILL BE HELD FOR 5 DAYS BEFORE ISSUING A FINAL NEGATIVE REPORT     Performed at [facility]   Report Status PENDING   Incomplete  URINE CULTURE     Status: None   Collection Time    12/02/13  3:53 AM      Result Value Range Status   Specimen Description URINE, CLEAN CATCH   Final   Special Requests NONE   Final   Culture  Setup Time     Final   Value: 12/02/2013 10:04     Performed at [facility] Count     Final   Value: 20,OOO COLONIES/ML     Performed at [facility]   Culture     Final   Value: Multiple bacterial morphotypes present, none predominant. Suggest appropriate recollection if clinically indicated.     Performed at [facility]   Report Status 12/03/2013 FINAL   Final     Studies:              All  Imaging reviewed and is as per above notation   Scheduled Meds: . aspirin EC  81 mg Oral Daily  . lactose free nutrition  237 mL Oral BID BM  . levofloxacin (LEVAQUIN) IV  750 mg Intravenous Q48H  . ondansetron  4 mg Intravenous Once   Continuous Infusions:    Assessment/Plan: 1. AKI-Stop diuretics.  Follow am labs.  D/c saline 12/10.  If resolves d/c am 2. Htn-Blood pressure stable off Anti-htn combo ace-Thiazide.  Monitor 3. Osteoporosis-PCP to decide on duration of Bone remodelling agent  Code Status: fll Family Communication: discussed with daughter Disposition Plan: inpt, likely d/c am   Pleas Koch, MD  [facility] Pager [phone] 12/03/2013, 3:39 PM    LOS: 1 day

## 2013-12-04 DIAGNOSIS — N179 Acute kidney failure, unspecified: Secondary | ICD-10-CM

## 2013-12-04 DIAGNOSIS — J189 Pneumonia, unspecified organism: Secondary | ICD-10-CM

## 2013-12-04 DIAGNOSIS — E860 Dehydration: Secondary | ICD-10-CM

## 2013-12-04 LAB — BASIC METABOLIC PANEL
BUN: 21 mg/dL (ref 6–23)
Chloride: 106 mEq/L (ref 96–112)
GFR calc Af Amer: 43 mL/min — ABNORMAL LOW (ref >90)
GFR calc non Af Amer: 37 mL/min — ABNORMAL LOW (ref >90)
Glucose, Bld: 108 mg/dL — ABNORMAL HIGH (ref 70–99)
Potassium: 3.3 mEq/L — ABNORMAL LOW (ref 3.5–5.1)
Sodium: 135 mEq/L (ref 135–145)

## 2013-12-04 MED ORDER — LEVOFLOXACIN 500 MG PO TABS: 500.0000 mg | ORAL_TABLET | Freq: Every day | ORAL | Status: AC

## 2013-12-04 MED ORDER — CALCIUM CARBONATE ANTACID 500 MG PO CHEW: 1.0000 | CHEWABLE_TABLET | Freq: Three times a day (TID) | ORAL | Status: AC

## 2013-12-04 NOTE — Progress Notes (Signed)
Physical Therapy Treatment Patient Details Name: Mackenzie Baker MRN: [id number] DOB: [birth year] Today's Date: 12/04/2013 Time: 4098-1191 PT Time Calculation (min): 23 min  PT Assessment / Plan / Recommendation  History of Present Illness Mackenzie Baker is an 79 y.o. female with hx of HTN on Zesteretic, osteoporosis on Fosamax, and taking celebrex, presented to her PCP complaining of general malaise.  She has no coughs, fever, chills, abdominal pain, but had intermittent lose stool that's not new for her.  Apparently, she was given Lasix about 2 weeks ago for bilateral lower extremity swellings, and took them for a week, then stopped as it made her ill.  She hasn't been able to eat or drink and felt more weak.  She said she has been depressed, but there is no significant identifiable stressor in her life. She lives alone.  Evalaution in the PCP office showed elevated Cr, so she was sent here. Repeat labs in the ER showed Cr of 1.91, with WBC of 11K, and Hb of 9.8 grams per dL. Her CXR showed possible atelectasis vs developing PNA. Hospitalist was asked to admit her for volume depletion, weakness, and possible CAP.  She was given IV Rocephin in the ER.   PT Comments   Progressing slowly with mobility. Pt sill fatigues fairly easily but she was able to increase ambulation distance.   Follow Up Recommendations  Home health PT;Supervision - Intermittent (home health aide, if possible)     Does the patient have the potential to tolerate intense rehabilitation     Barriers to Discharge        Equipment Recommendations  None recommended by PT    Recommendations for Other Services OT consult  Frequency Min 3X/week   Progress towards PT Goals Progress towards PT goals: Progressing toward goals (slowly)  Plan Current plan remains appropriate    Precautions / Restrictions Precautions Precautions: Fall Restrictions Weight Bearing Restrictions: No   Pertinent Vitals/Pain No c/o pain     Mobility  Bed Mobility Bed Mobility: Supine to Sit Supine to Sit: 5: Supervision Transfers Transfers: Sit to Stand;Stand to Sit;Stand Pivot Transfers Sit to Stand: 4: Min guard;From bed;From chair/3-in-1 Stand to Sit: 4: Min guard;To chair/3-in-1;To bed Stand Pivot Transfers: 4: Min guard Details for Transfer Assistance: VCS safety, hand placement. Some difficulty rising but pt did perform unassisted.  Ambulation/Gait Ambulation/Gait Assistance: 4: Min guard Ambulation Distance (Feet): 100 Feet Assistive device: Rolling walker Ambulation/Gait Assistance Details: slow gait speed. fatigues fairly easily. close guard for safety.  Gait Pattern: Step-through pattern;Trunk flexed;Decreased stride length    Exercises     PT Diagnosis:    PT Problem List:   PT Treatment Interventions:     PT Goals (current goals can now be found in the care plan section)    Visit Information  Last PT Received On: 12/04/13 Assistance Needed: +1 History of Present Illness: Mackenzie Baker is an 79 y.o. female with hx of HTN on Zesteretic, osteoporosis on Fosamax, and taking celebrex, presented to her PCP complaining of general malaise.  She has no coughs, fever, chills, abdominal pain, but had intermittent lose stool that's not new for her.  Apparently, she was given Lasix about 2 weeks ago for bilateral lower extremity swellings, and took them for a week, then stopped as it made her ill.  She hasn't been able to eat or drink and felt more weak.  She said she has been depressed, but there is no significant identifiable stressor in her life. She lives alone.  Evalaution in the PCP office showed elevated Cr, so she was sent here. Repeat labs in the ER showed Cr of 1.91, with WBC of 11K, and Hb of 9.8 grams per dL. Her CXR showed possible atelectasis vs developing PNA. Hospitalist was asked to admit her for volume depletion, weakness, and possible CAP.  She was given IV Rocephin in the ER.    Subjective Data       Cognition  Cognition Arousal/Alertness: Awake/alert Behavior During Therapy: WFL for tasks assessed/performed Overall Cognitive Status: Within Functional Limits for tasks assessed    Balance     End of Session PT - End of Session Equipment Utilized During Treatment: Gait belt Activity Tolerance: Patient tolerated treatment well;Patient limited by fatigue Patient left: in chair;with call bell/phone within reach   GP     Rebeca Alert, MPT Pager: [phone]

## 2013-12-04 NOTE — Discharge Summary (Signed)
Physician Discharge Summary  Mackenzie Baker UJW:[id number] DOB: December 12, 1920 DOA: 12/02/2013  PCP: Neldon Labella, MD  Admit date: 12/02/2013 Discharge date: 12/04/2013  Time spent: 35 minutes  Recommendations for Outpatient Follow-up:  1. Complete Levaquin 12/14 2. Consider repeat chest x-ray in 4 weeks to ensure clearing 3. Consider simplifying medications including pressure meds, omeprazole  4. Patient instructed to get daily blood blood pressures  Discharge Diagnoses:  Principal Problem:   Volume depletion Active Problems:   AKI (acute kidney injury)   CAP (community acquired pneumonia)   Weakness generalized   DNR (do not resuscitate)   HTN (hypertension)   Dehydration   Protein-calorie malnutrition, severe   Discharge Condition: Good  Diet recommendation: Regular  Filed Weights   12/02/13 0132 12/02/13 0651  Weight: 64.411 kg (142 lb) 64.411 kg (142 lb)    History of present illness:  Pleasant 79 year old ? known hypertension osteoporosis admitted with volume depletion with a creatinine 1.91 in setting of diuretic use Noted to have some chest x-ray findings consistent with potential pneumonia See below for full details   Hospital Course:   1. AKI-discontinued both diuretics, ace 1 inhibitor on admission. Initially kept on IV saline 75 cc per hour -creatinine trended back down to 1.21 2. Htn-Blood pressure stable off Anti-htn combo ace-Thiazide. Was actually low normal blood sugar and 90s over 50s without any orthostasis or dizziness.  It was  recommended to the patient for primary care physician regarding optimal use of diuretics and blood pressure meds. She was recommended to followup with her primary care physician to discuss this further 3. Osteoporosis-PCP to decide on duration of Bone remodelling agent-have discontinued her Prilosec and kept her on TUMS in its place.   Discharge Exam: Filed Vitals:   12/04/13 1110  BP: 112/62  Pulse: 101  Temp:    Resp:    Alert pleasant oriented  General: EOMI Cardiovascular: NCAT S1-S2 no murmur or gallop Respiratory: Clinically clear  Discharge Instructions      Discharge Orders   Future Orders Complete By Expires   Diet - low sodium heart healthy  As directed    Discharge instructions  As directed    Comments:     Do not take blood pressure meds Get blood pressure re-check at PCP office or at home I have simplified some of your meds, and if you do not need it, please do not take Celebrex You may take TUMs in place of your Omeprazole. Good luck and Happy Holidays!   Increase activity slowly  As directed        Medication List    STOP taking these medications       celecoxib 200 MG capsule  Commonly known as:  CELEBREX     hydrochlorothiazide 12.5 MG capsule  Commonly known as:  MICROZIDE     lisinopril 40 MG tablet  Commonly known as:  PRINIVIL,ZESTRIL     omeprazole 20 MG capsule  Commonly known as:  PRILOSEC      TAKE these medications       alendronate 70 MG tablet  Commonly known as:  FOSAMAX  Take 70 mg by mouth once a week. Take with a full glass of water on an empty stomach.   Takes on thursdays     aspirin EC 81 MG tablet  Take 81 mg by mouth daily.     calcium carbonate 500 MG chewable tablet  Commonly known as:  TUMS  Chew 1 tablet (200 mg of elemental calcium total)  by mouth 3 (three) times daily.     levofloxacin 500 MG tablet  Commonly known as:  LEVAQUIN  Take 1 tablet (500 mg total) by mouth daily.     VITAMIN D PO  Take 5,000 Units by mouth daily.       Allergies  Allergen Reactions  . Penicillins Nausea And Vomiting   Follow-up Information   Follow up with [facility]. (Agency will call to arrange scheduled home health services)    Contact information:   [address] [place] [address] [phone]        The results of significant diagnostics from this hospitalization (including imaging,  microbiology, ancillary and laboratory) are listed below for reference.    Significant Diagnostic Studies: Dg Chest 2 View  12/02/2013   CLINICAL DATA:  Shortness of breath and weakness.  EXAM: CHEST  2 VIEW  COMPARISON:  Chest radiograph performed 12/01/2013  FINDINGS: The lungs are well-aerated. Mild bibasilar opacities may reflect atelectasis or possibly mild pneumonia. Mild peribronchial thickening is seen. No pleural effusion or pneumothorax is seen.  The heart is normal in size; the mediastinal contour is within normal limits. No acute osseous abnormalities are seen. Degenerative change is noted at the upper lumbar spine.  IMPRESSION: Mild bibasilar airspace opacities may reflect atelectasis or possibly mild pneumonia. Mild peribronchial thickening seen.   Electronically Signed   By: Roanna Raider M.D.   On: 12/02/2013 03:23    Microbiology: Recent Results (from the past 240 hour(s))  CULTURE, BLOOD (ROUTINE X 2)     Status: None   Collection Time    12/02/13  2:23 AM      Result Value Range Status   Specimen Description BLOOD LEFT ANTECUBITAL   Final   Special Requests BOTTLES DRAWN AEROBIC AND ANAEROBIC 5CC   Final   Culture  Setup Time     Final   Value: 12/02/2013 08:57     Performed at [facility]   Culture     Final   Value:        BLOOD CULTURE RECEIVED NO GROWTH TO DATE CULTURE WILL BE HELD FOR 5 DAYS BEFORE ISSUING A FINAL NEGATIVE REPORT     Performed at [facility]   Report Status PENDING   Incomplete  CULTURE, BLOOD (ROUTINE X 2)     Status: None   Collection Time    12/02/13  2:35 AM      Result Value Range Status   Specimen Description BLOOD LEFT ANTECUBITAL   Final   Special Requests BOTTLES DRAWN AEROBIC AND ANAEROBIC RIGHT ARM   Final   Culture  Setup Time     Final   Value: 12/02/2013 08:56     Performed at [facility]   Culture     Final   Value:        BLOOD CULTURE RECEIVED NO GROWTH TO DATE CULTURE WILL BE HELD FOR 5 DAYS BEFORE  ISSUING A FINAL NEGATIVE REPORT     Performed at [facility]   Report Status PENDING   Incomplete  URINE CULTURE     Status: None   Collection Time    12/02/13  3:53 AM      Result Value Range Status   Specimen Description URINE, CLEAN CATCH   Final   Special Requests NONE   Final   Culture  Setup Time     Final   Value: 12/02/2013 10:04     Performed  at [facility] Count     Final   Value: 20,OOO COLONIES/ML     Performed at [facility]   Culture     Final   Value: Multiple bacterial morphotypes present, none predominant. Suggest appropriate recollection if clinically indicated.     Performed at [facility]   Report Status 12/03/2013 FINAL   Final     Labs: Basic Metabolic Panel:  Recent Labs Lab 12/02/13 0223 12/03/13 0540 12/04/13 0540  NA 137 135 135  K 4.4 3.3* 3.3*  CL 104 106 106  CO2 16* 18* 19  GLUCOSE 114* 142* 108*  BUN 60* 29* 21  CREATININE 1.91* 1.39* 1.23*  CALCIUM 9.4 8.1* 8.1*   Liver Function Tests: No results found for this basename: AST, ALT, ALKPHOS, BILITOT, PROT, ALBUMIN,  in the last 168 hours No results found for this basename: LIPASE, AMYLASE,  in the last 168 hours No results found for this basename: AMMONIA,  in the last 168 hours CBC:  Recent Labs Lab 12/02/13 0223 12/03/13 0540  WBC 11.7* 11.8*  NEUTROABS 9.7*  --   HGB 9.8* 9.3*  HCT 29.4* 27.5*  MCV 91.3 91.7  PLT 294 239   Cardiac Enzymes: No results found for this basename: CKTOTAL, CKMB, CKMBINDEX, TROPONINI,  in the last 168 hours BNP: BNP (last 3 results) No results found for this basename: PROBNP,  in the last 8760 hours CBG: No results found for this basename: GLUCAP,  in the last 168 hours     Signed:  Rhetta Mura  [facility] 12/04/2013, 12:39 PM

## 2013-12-04 NOTE — Progress Notes (Deleted)
Physician Discharge Summary  Mackenzie Baker AVW:[id number] DOB: Jun 17, 1920 DOA: 12/02/2013  PCP: Neldon Labella, MD  Admit date: 12/02/2013 Discharge date: 12/04/2013  Time spent: 35 minutes  Recommendations for Outpatient Follow-up:  1. Complete Levaquin 12/14 2. Consider repeat chest x-ray in 4 weeks to ensure clearing 3. Consider simplifying medications including pressure meds, omeprazole  4. Patient instructed to get daily blood blood pressures  Discharge Diagnoses:  Principal Problem:   Volume depletion Active Problems:   AKI (acute kidney injury)   CAP (community acquired pneumonia)   Weakness generalized   DNR (do not resuscitate)   HTN (hypertension)   Dehydration   Protein-calorie malnutrition, severe   Discharge Condition: Good  Diet recommendation: Regular  Filed Weights   12/02/13 0132 12/02/13 0651  Weight: 64.411 kg (142 lb) 64.411 kg (142 lb)    History of present illness:  Pleasant 79 year old ? known hypertension osteoporosis admitted with volume depletion with a creatinine 1.91 in setting of diuretic use Noted to have some chest x-ray findings consistent with potential pneumonia See below for full details   Hospital Course:   1. AKI-discontinued both diuretics, ace 1 inhibitor on admission. Initially kept on IV saline 75 cc per hour -creatinine trended back down to 1.21 2. Htn-Blood pressure stable off Anti-htn combo ace-Thiazide. Was actually low normal blood sugar and 90s over 50s without any orthostasis or dizziness.  It was  recommended to the patient for primary care physician regarding optimal use of diuretics and blood pressure meds. She was recommended to followup with her primary care physician to discuss this further 3. Osteoporosis-PCP to decide on duration of Bone remodelling agent-have discontinued her Prilosec and kept her on TUMS in its place.   Discharge Exam: Filed Vitals:   12/04/13 0600  BP: 93/54  Pulse: 79  Temp: 98.3 F  (36.8 C)  Resp: 20   Alert pleasant oriented  General: EOMI Cardiovascular: NCAT S1-S2 no murmur or gallop Respiratory: Clinically clear  Discharge Instructions  Discharge Orders   Future Orders Complete By Expires   Diet - low sodium heart healthy  As directed    Discharge instructions  As directed    Comments:     Do not take blood pressure meds Get blood pressure re-check at PCP office or at home I have simplified some of your meds, and if you do not need it, please do not take Celebrex You may take TUMs in place of your Omeprazole. Good luck and Happy Holidays!   Increase activity slowly  As directed        Medication List    STOP taking these medications       celecoxib 200 MG capsule  Commonly known as:  CELEBREX     hydrochlorothiazide 12.5 MG capsule  Commonly known as:  MICROZIDE     lisinopril 40 MG tablet  Commonly known as:  PRINIVIL,ZESTRIL     omeprazole 20 MG capsule  Commonly known as:  PRILOSEC      TAKE these medications       alendronate 70 MG tablet  Commonly known as:  FOSAMAX  Take 70 mg by mouth once a week. Take with a full glass of water on an empty stomach.   Takes on thursdays     aspirin EC 81 MG tablet  Take 81 mg by mouth daily.     calcium carbonate 500 MG chewable tablet  Commonly known as:  TUMS  Chew 1 tablet (200 mg of elemental calcium total) by  mouth 3 (three) times daily.     levofloxacin 500 MG tablet  Commonly known as:  LEVAQUIN  Take 1 tablet (500 mg total) by mouth daily.     VITAMIN D PO  Take 5,000 Units by mouth daily.       Allergies  Allergen Reactions  . Penicillins Nausea And Vomiting      The results of significant diagnostics from this hospitalization (including imaging, microbiology, ancillary and laboratory) are listed below for reference.    Significant Diagnostic Studies: Dg Chest 2 View  12/02/2013   CLINICAL DATA:  Shortness of breath and weakness.  EXAM: CHEST  2 VIEW  COMPARISON:   Chest radiograph performed 12/01/2013  FINDINGS: The lungs are well-aerated. Mild bibasilar opacities may reflect atelectasis or possibly mild pneumonia. Mild peribronchial thickening is seen. No pleural effusion or pneumothorax is seen.  The heart is normal in size; the mediastinal contour is within normal limits. No acute osseous abnormalities are seen. Degenerative change is noted at the upper lumbar spine.  IMPRESSION: Mild bibasilar airspace opacities may reflect atelectasis or possibly mild pneumonia. Mild peribronchial thickening seen.   Electronically Signed   By: Roanna Raider M.D.   On: 12/02/2013 03:23    Microbiology: Recent Results (from the past 240 hour(s))  CULTURE, BLOOD (ROUTINE X 2)     Status: None   Collection Time    12/02/13  2:23 AM      Result Value Range Status   Specimen Description BLOOD LEFT ANTECUBITAL   Final   Special Requests BOTTLES DRAWN AEROBIC AND ANAEROBIC 5CC   Final   Culture  Setup Time     Final   Value: 12/02/2013 08:57     Performed at [facility]   Culture     Final   Value:        BLOOD CULTURE RECEIVED NO GROWTH TO DATE CULTURE WILL BE HELD FOR 5 DAYS BEFORE ISSUING A FINAL NEGATIVE REPORT     Performed at [facility]   Report Status PENDING   Incomplete  CULTURE, BLOOD (ROUTINE X 2)     Status: None   Collection Time    12/02/13  2:35 AM      Result Value Range Status   Specimen Description BLOOD LEFT ANTECUBITAL   Final   Special Requests BOTTLES DRAWN AEROBIC AND ANAEROBIC RIGHT ARM   Final   Culture  Setup Time     Final   Value: 12/02/2013 08:56     Performed at [facility]   Culture     Final   Value:        BLOOD CULTURE RECEIVED NO GROWTH TO DATE CULTURE WILL BE HELD FOR 5 DAYS BEFORE ISSUING A FINAL NEGATIVE REPORT     Performed at [facility]   Report Status PENDING   Incomplete  URINE CULTURE     Status: None   Collection Time    12/02/13  3:53 AM      Result Value Range Status   Specimen  Description URINE, CLEAN CATCH   Final   Special Requests NONE   Final   Culture  Setup Time     Final   Value: 12/02/2013 10:04     Performed at [facility] Count     Final   Value: 20,OOO COLONIES/ML     Performed at [facility]   Culture     Final   Value: Multiple bacterial  morphotypes present, none predominant. Suggest appropriate recollection if clinically indicated.     Performed at [facility]   Report Status 12/03/2013 FINAL   Final     Labs: Basic Metabolic Panel:  Recent Labs Lab 12/02/13 0223 12/03/13 0540 12/04/13 0540  NA 137 135 135  K 4.4 3.3* 3.3*  CL 104 106 106  CO2 16* 18* 19  GLUCOSE 114* 142* 108*  BUN 60* 29* 21  CREATININE 1.91* 1.39* 1.23*  CALCIUM 9.4 8.1* 8.1*   Liver Function Tests: No results found for this basename: AST, ALT, ALKPHOS, BILITOT, PROT, ALBUMIN,  in the last 168 hours No results found for this basename: LIPASE, AMYLASE,  in the last 168 hours No results found for this basename: AMMONIA,  in the last 168 hours CBC:  Recent Labs Lab 12/02/13 0223 12/03/13 0540  WBC 11.7* 11.8*  NEUTROABS 9.7*  --   HGB 9.8* 9.3*  HCT 29.4* 27.5*  MCV 91.3 91.7  PLT 294 239   Cardiac Enzymes: No results found for this basename: CKTOTAL, CKMB, CKMBINDEX, TROPONINI,  in the last 168 hours BNP: BNP (last 3 results) No results found for this basename: PROBNP,  in the last 8760 hours CBG: No results found for this basename: GLUCAP,  in the last 168 hours     Signed:  Rhetta Mura  [facility] 12/04/2013, 10:58 AM

## 2013-12-04 NOTE — Care Management Note (Signed)
Cm spoke with patient's daughter Cameshia Cressman at [phone] concerning discharge planning per pt's permission. Pt's daughter offered choice for [facility] services. Per choice [facility] to provide [facility] services at discharge. [facility] rep Lanae Crumbly notified of MD orders for [facility] services and DME. No other needs identified at this time.     Roxy Manns Eldonna Neuenfeldt,MSN,RN [phone]

## 2013-12-04 NOTE — Progress Notes (Signed)
Occupational Therapy Treatment Patient Details Name: Mackenzie Baker MRN: [id number] DOB: [birth year] Today's Date: 12/04/2013 Time: 7829-5621 OT Time Calculation (min): 23 min  OT Assessment / Plan / Recommendation  History of present illness Mackenzie Baker is an 79 y.o. female with hx of HTN on Zesteretic, osteoporosis on Fosamax, and taking celebrex, presented to her PCP complaining of general malaise.  She has no coughs, fever, chills, abdominal pain, but had intermittent lose stool that's not new for her.  Apparently, she was given Lasix about 2 weeks ago for bilateral lower extremity swellings, and took them for a week, then stopped as it made her ill.  She hasn't been able to eat or drink and felt more weak.  She said she has been depressed, but there is no significant identifiable stressor in her life. She lives alone.  Evalaution in the PCP office showed elevated Cr, so she was sent here. Repeat labs in the ER showed Cr of 1.91, with WBC of 11K, and Hb of 9.8 grams per dL. Her CXR showed possible atelectasis vs developing PNA. Hospitalist was asked to admit her for volume depletion, weakness, and possible CAP.  She was given IV Rocephin in the ER.   OT comments  Recommend that pts daughter provides 24.7 A initiallly  Follow Up Recommendations  Home health OT;Supervision/Assistance - 24 hour       Equipment Recommendations  3 in 1 bedside comode       Frequency Min 2X/week   Progress towards OT Goals Progress towards OT goals: Progressing toward goals  Plan Discharge plan remains appropriate    Precautions / Restrictions Precautions Precautions: Fall Restrictions Weight Bearing Restrictions: No       ADL  Lower Body Dressing: Min guard Where Assessed - Lower Body Dressing: Unsupported sit to stand Toilet Transfer: Supervision/safety Toilet Transfer Method: Sit to stand;Stand pivot Toilet Transfer Equipment: Bedside commode Toileting - Clothing Manipulation and Hygiene:  Supervision/safety Where Assessed - Toileting Clothing Manipulation and Hygiene: Standing ADL Comments: discussed options of HHOT or SNF.  Pt is nervous about going home but does not want to rehab.  Feel pt will need A at home.  OT did share this with pt       OT Goals(current goals can now be found in the care plan section) Acute Rehab OT Goals Patient Stated Goal: get better and take care of my self  Visit Information  Last OT Received On: 12/04/13 Assistance Needed: +1 History of Present Illness: Mackenzie Baker is an 79 y.o. female with hx of HTN on Zesteretic, osteoporosis on Fosamax, and taking celebrex, presented to her PCP complaining of general malaise.  She has no coughs, fever, chills, abdominal pain, but had intermittent lose stool that's not new for her.  Apparently, she was given Lasix about 2 weeks ago for bilateral lower extremity swellings, and took them for a week, then stopped as it made her ill.  She hasn't been able to eat or drink and felt more weak.  She said she has been depressed, but there is no significant identifiable stressor in her life. She lives alone.  Evalaution in the PCP office showed elevated Cr, so she was sent here. Repeat labs in the ER showed Cr of 1.91, with WBC of 11K, and Hb of 9.8 grams per dL. Her CXR showed possible atelectasis vs developing PNA. Hospitalist was asked to admit her for volume depletion, weakness, and possible CAP.  She was given IV Rocephin in the ER.  Cognition  Cognition Arousal/Alertness: Awake/alert Behavior During Therapy: WFL for tasks assessed/performed Overall Cognitive Status: Within Functional Limits for tasks assessed    Mobility  Bed Mobility Bed Mobility: Supine to Sit Supine to Sit: 5: Supervision Sit to Supine: 5: Supervision Transfers Transfers: Sit to Stand;Stand to Sit Sit to Stand: 4: Min guard;From bed;From chair/3-in-1;5: Supervision Stand to Sit: To chair/3-in-1;To bed;5: Supervision Details  for Transfer Assistance: VCS safety, hand placement. Some difficulty rising but pt did perform unassisted.           End of Session OT - End of Session Activity Tolerance: Patient tolerated treatment well Patient left: in bed  GO     Mackenzie Baker, Mackenzie Baker 12/04/2013, 12:57 PM

## 2013-12-08 LAB — CULTURE, BLOOD (ROUTINE X 2)
Culture: NO GROWTH
Culture: NO GROWTH

## 2014-04-20 IMAGING — CR DG CHEST 2V
2 series · 2 of 2 positions shown · non-contrast
Comparison: Chest radiograph performed 12/01/2013

CLINICAL DATA: Shortness of breath and weakness.

EXAM:
CHEST  2 VIEW

[w chest lat]
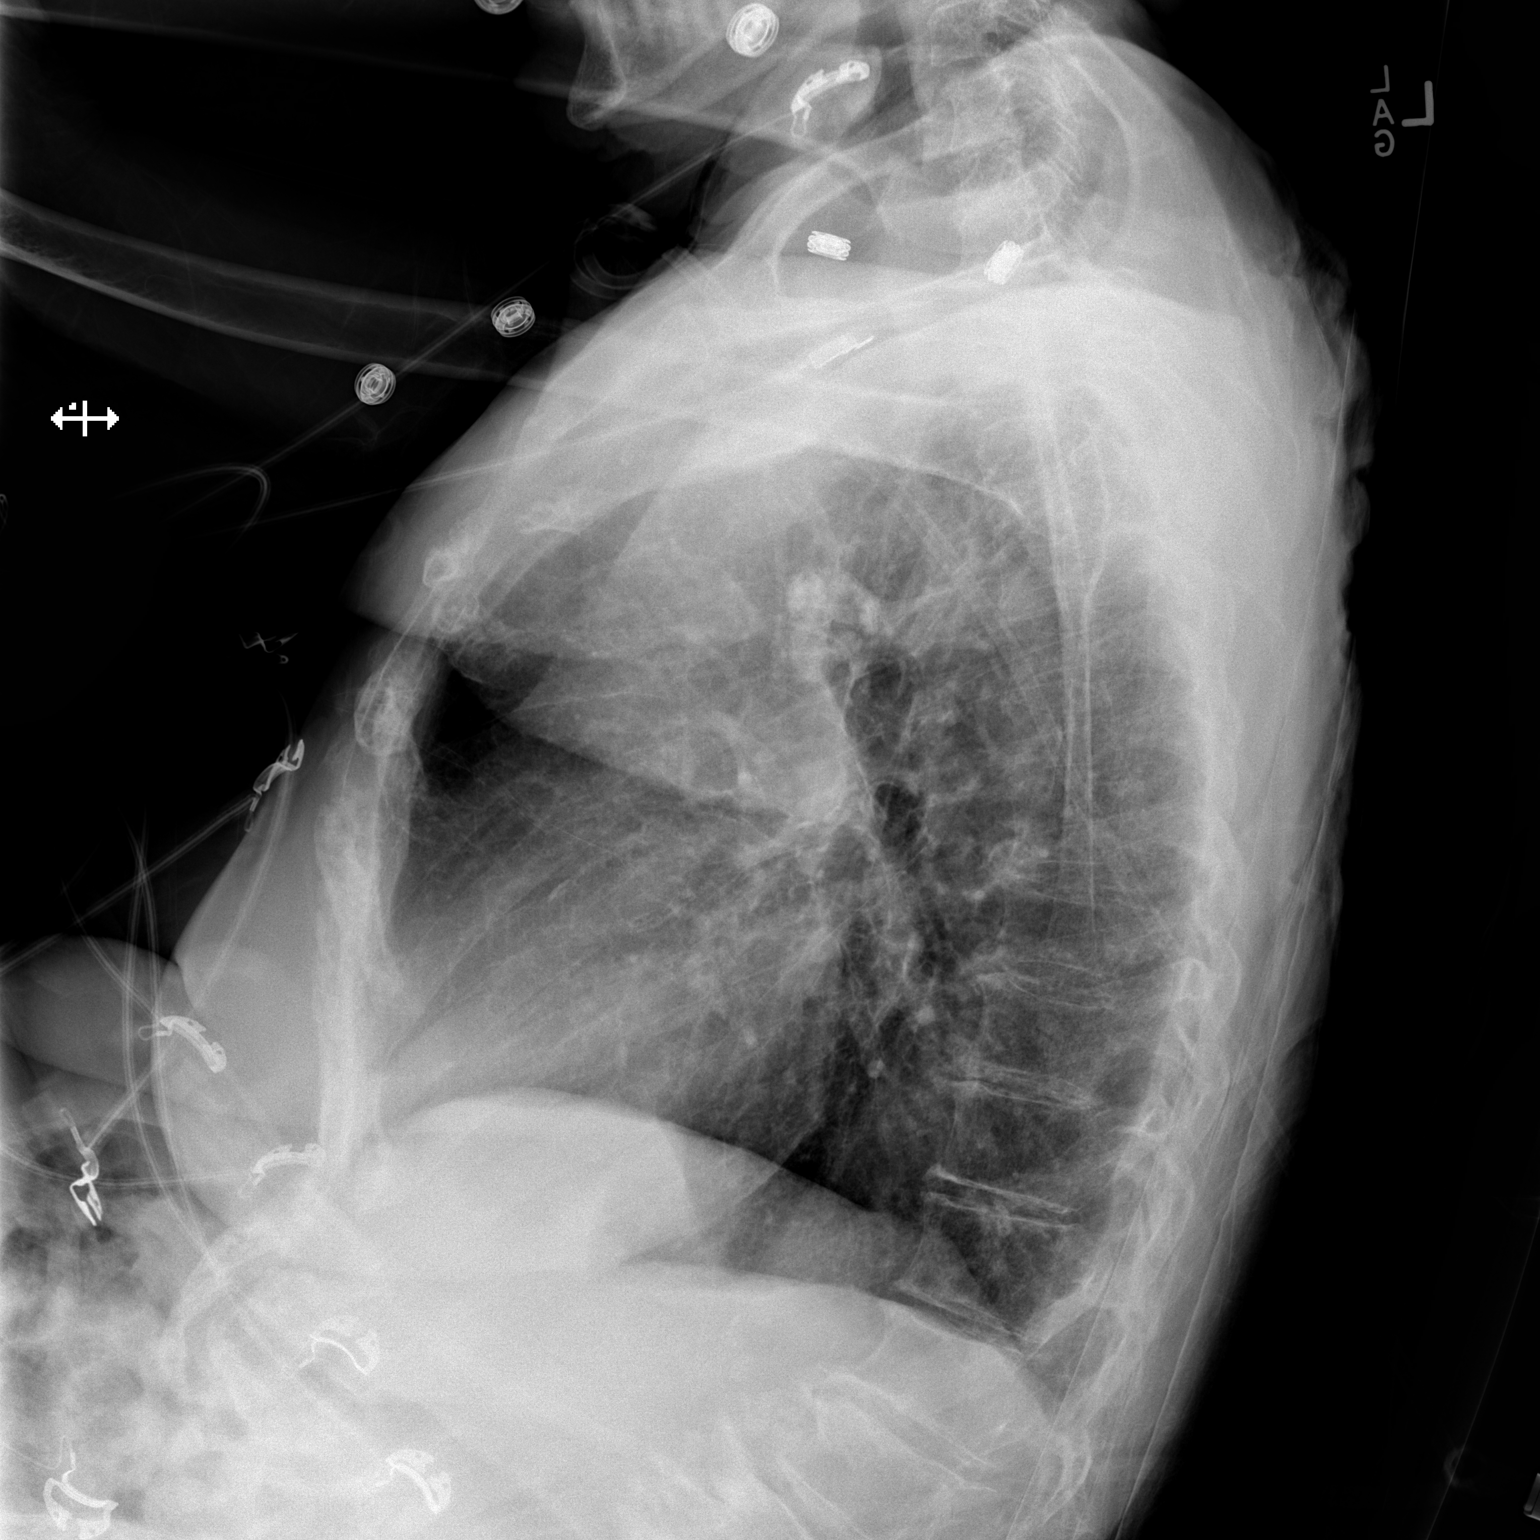

[x chest ap]
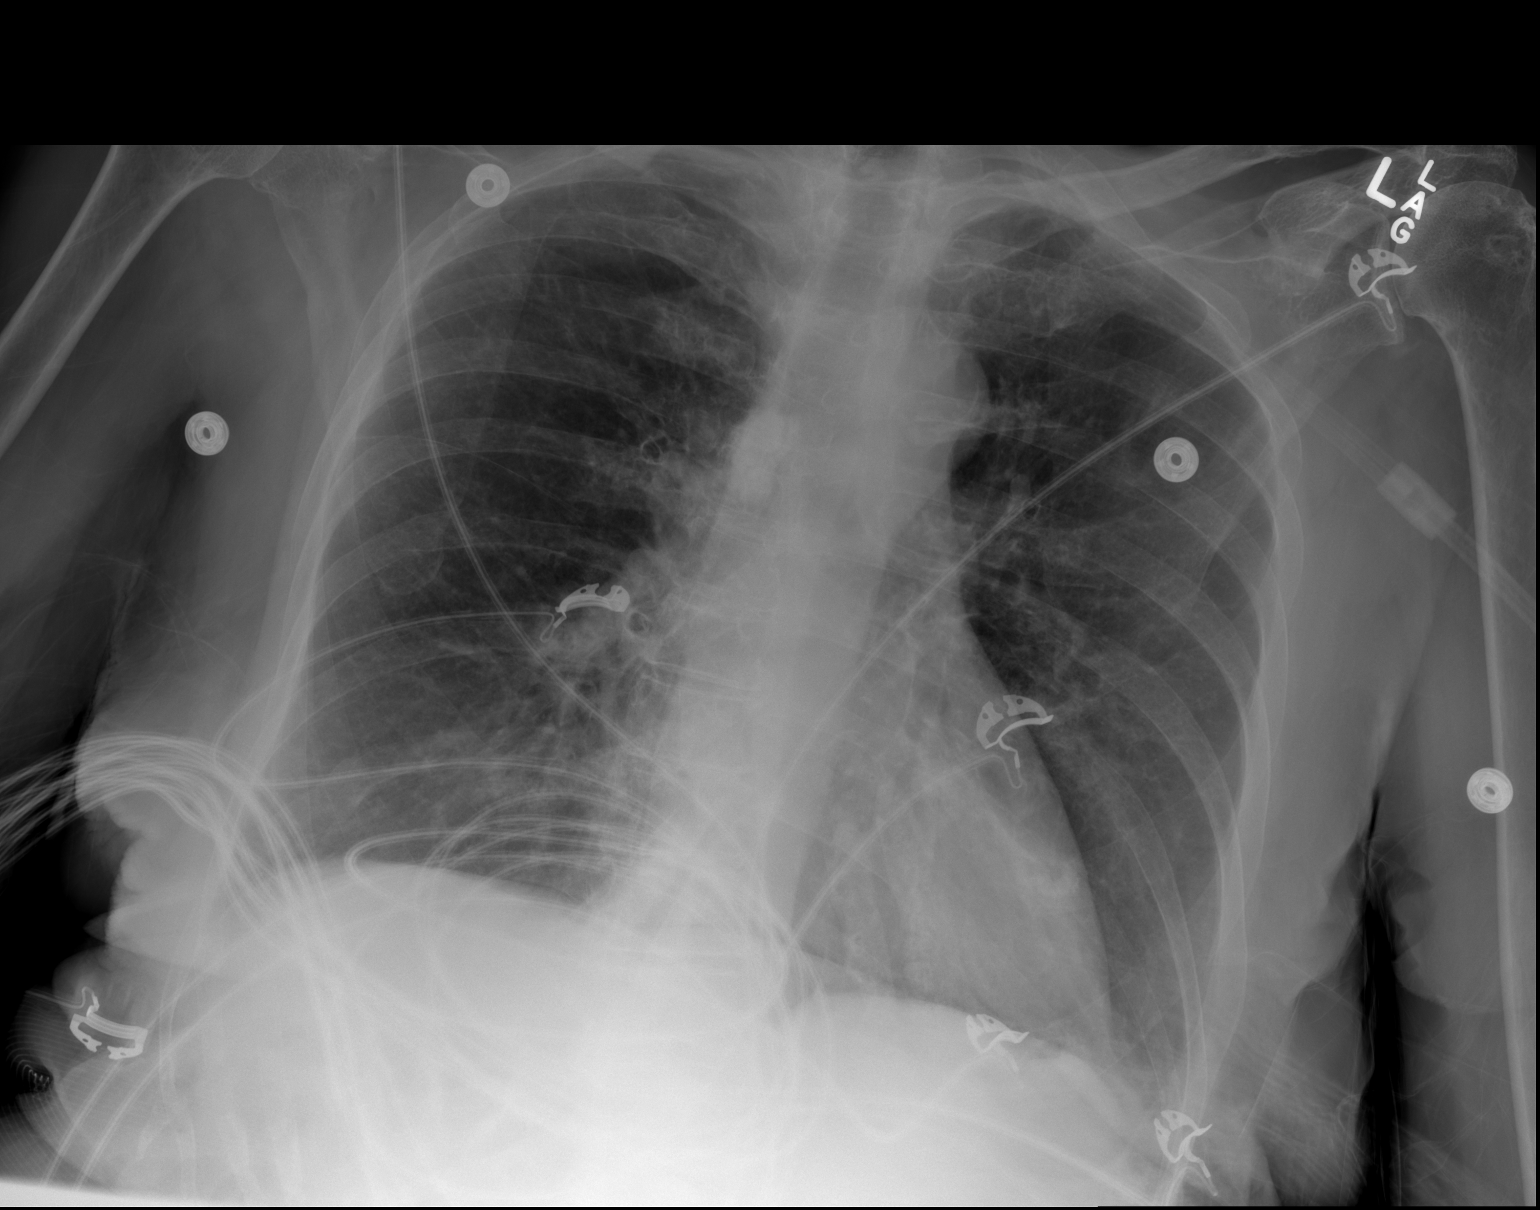

[2 of 2 positions shown; findings below may reference images not displayed]

FINDINGS: The lungs are well-aerated. Mild bibasilar opacities may reflect
atelectasis or possibly mild pneumonia. Mild peribronchial
thickening is seen. No pleural effusion or pneumothorax is seen.

The heart is normal in size; the mediastinal contour is within
normal limits. No acute osseous abnormalities are seen. Degenerative
change is noted at the upper lumbar spine.
IMPRESSION: Mild bibasilar airspace opacities may reflect atelectasis or
possibly mild pneumonia. Mild peribronchial thickening seen.

## 2015-04-07 ENCOUNTER — Other Ambulatory Visit: Payer: Self-pay | Admitting: *Deleted

## 2015-04-07 NOTE — Patient Outreach (Signed)
[facility] Care Management  04/07/2015  Chrisandra NettersDorothy Cobey 12-Oct-1920 [id number]   [employer] referral:  Telephone contact with patient. Patient verifies that she has [employer] Medicare health insurance.  Patient has primary care doctor which she sees at regularly scheduled intervals. Most recent appointment 04/01/15.  Also seeing orthopedist for right knee pain. States  had series of 3 injections last year. States most recent appointment 01/18/2015. Patient states she has decided that she will not be having any knee surgery.   Currently using rollator or rolling walker to get around. Patient states she has never had any falls. Patient advised of safety tips in the home. Patient keeps phone on her person at all times. Knows when to call 911. Has support from her daughter and neighbor if needed.    Patient has no problems with transportation. Drives herself to doctors' appointments and to grocery store.  [facility] for long term medications and local pharmacy for short term prescriptions. Patient states she is  taking medications as prescribed by doctors.   States no major health concerns.  No care management needs assessed at this time. Patient advised of [facility] and was given contact information including 24 hour Nurse Advise Line.   Plan: will close case.     Colleen CanLinda Amarianna Abplanalp, RN BSN CCM Care Management Coordinator [facility] Care Management  [phone]

## 2015-04-12 NOTE — Patient Outreach (Signed)
[facility]) Care Management  04/12/2015  Mackenzie NettersDorothy Baker 17-Jan-1920 [id number]   Received notification from Colleen CanLinda Manning, RN to close case for [facility] Care Management due to assessed no further interventions.  Case closed at this time.  Corrie MckusickLisa O. [facility] Care Management [facility] CM Assistant Phone: [phone] Fax: [phone]

## 2021-03-25 DEATH — deceased
